# Patient Record
Sex: Female | Born: 1980 | Race: White | Hispanic: Yes | Marital: Single | Smoking: Never smoker
Health system: Southern US, Community
[De-identification: ages and names within clinical notes are randomized; demographics above are authoritative.]

---

## 2002-11-12 ENCOUNTER — Inpatient Hospital Stay (HOSPITAL_COMMUNITY): Admission: AD | Admit: 2002-11-12 | Discharge: 2002-11-16 | Payer: Self-pay | Admitting: Obstetrics & Gynecology

## 2006-03-01 ENCOUNTER — Emergency Department (HOSPITAL_COMMUNITY): Admission: EM | Admit: 2006-03-01 | Discharge: 2006-03-02 | Payer: Self-pay | Admitting: Emergency Medicine

## 2006-06-09 ENCOUNTER — Ambulatory Visit: Payer: Self-pay | Admitting: Family Medicine

## 2006-06-09 ENCOUNTER — Inpatient Hospital Stay (HOSPITAL_COMMUNITY): Admission: AD | Admit: 2006-06-09 | Discharge: 2006-06-09 | Payer: Self-pay | Admitting: Gynecology

## 2006-06-09 ENCOUNTER — Inpatient Hospital Stay (HOSPITAL_COMMUNITY): Admission: AD | Admit: 2006-06-09 | Discharge: 2006-06-09 | Payer: Self-pay | Admitting: Family Medicine

## 2006-07-28 ENCOUNTER — Ambulatory Visit: Payer: Self-pay | Admitting: Gynecology

## 2006-07-29 ENCOUNTER — Inpatient Hospital Stay (HOSPITAL_COMMUNITY): Admission: RE | Admit: 2006-07-29 | Discharge: 2006-08-01 | Payer: Self-pay | Admitting: Gynecology

## 2006-07-29 ENCOUNTER — Ambulatory Visit: Payer: Self-pay | Admitting: Obstetrics & Gynecology

## 2006-08-11 ENCOUNTER — Ambulatory Visit: Payer: Self-pay | Admitting: *Deleted

## 2006-08-11 ENCOUNTER — Inpatient Hospital Stay (HOSPITAL_COMMUNITY): Admission: AD | Admit: 2006-08-11 | Discharge: 2006-08-11 | Payer: Self-pay | Admitting: Family Medicine

## 2010-07-03 NOTE — Discharge Summary (Signed)
[name], Laura Terry        ACCOUNT NO.:  [id number]   MEDICAL RECORD NO.:  [id number]          PATIENT TYPE:  INP   LOCATION:  9130                          FACILITY:  [facility]   PHYSICIAN:  Allie Bossier, MD        DATE OF BIRTH:  1980-09-26   DATE OF ADMISSION:  07/29/2006  DATE OF DISCHARGE:  08/01/2006                               DISCHARGE SUMMARY   ADMISSION DIAGNOSES:  1. Intrauterine pregnancy at term.  2. History of previous cesarean section.  3. Anemia.   DISCHARGE DIAGNOSES:  1. Term pregnancy delivered via cesarean section.  2. History of previous cesarean section.  3. Anemia of pregnancy.   PRENATAL LABORATORY DATA:  Blood type O positive, RPR nonreactive,  rubella immune, hepatitis B surface antigen negative, HIV nonreactive.   HOSPITAL COURSE:  The patient is a 30 year old gravida 2, para 1-0-0-1  at 55+ weeks' gestation who was admitted for an elective repeat cesarean  section.  The patient underwent repeat low transverse cesarean section  with delivery of a viable female infant with Apgars of 9 and 9 and birth  weight 7 pounds 8 ounces.  Please see operative note for complete  details of cesarean section.  Her postoperative course was unremarkable.  The patient remained hemodynamically stable and was felt to be stable  for discharge on postoperative day #3.  She was discharged home with  instructions to follow up at the [facility] Department in 6  weeks for her postpartum visit, pelvic rest  for 6 weeks, no heavy  lifting for 6 weeks.   Discharge medications included:  1. Ibuprofen 600 mg take 1 tablet every 6 hours as needed for pain.  2. Percocet 5/325 mg take 1 tablet every 4 to 6 hours as needed for      pain.  3. Prenatal vitamins 1 tablet daily.   For contraception, she will be getting the IUD at her 6-week postpartum  visit.  Her diet is regular.     ______________________________  [name], MD      Allie Bossier, MD  Electronically Signed   LNJ/MEDQ  D:  08/01/2006  T:  08/01/2006  Job:  [id number]

## 2010-07-03 NOTE — Op Note (Signed)
Laura Terry, Laura Terry        ACCOUNT NO.:  [id number]   MEDICAL RECORD NO.:  [id number]          PATIENT TYPE:  INP   LOCATION:  9130                          [facility]  [facility]   PHYSICIAN:  Allie Bossier, MD        DATE OF BIRTH:  Jul 07, 1980   DATE OF PROCEDURE:  07/29/2006  DATE OF DISCHARGE:                               OPERATIVE REPORT   PREOPERATIVE DIAGNOSIS:  IUP at term, history of previous cesarean  section.   POSTOPERATIVE DIAGNOSIS:  IUP at term, history of previous cesarean  section.   PROCEDURE:  Repeat low transverse cesarean section.   SURGEON:  Dr. Nicholaus Bloom   ASSISTANT:  Dr. Wilburt Finlay   ANESTHESIA:  Spinal and local.   SPECIMENS:  Placenta sent to labor and delivery. The patient also was  cord blood donor.   ESTIMATED BLOOD LOSS:  600 mL.   COMPLICATIONS:  None.   FINDINGS:  Viable female infant with Apgars of nine and nine.  Birth  weight 7 pounds 8 ounces.   REASON FOR PROCEDURE:  The patient is a gravida 2, para 1, at +40 weeks'  gestation who presents for an elective repeat cesarean section.   PROCEDURE:  The patient was taken to the operating room where her spinal  anesthesia was found to be adequate.  She was then prepped and draped in  the normal sterile fashion in the dorsal supine position with a leftward  tilt.  The Pfannenstiel skin incision was then made with scalpel and  carried through to the underlying layer of fascia.  The fascia was  incised in the midline and the incision extended laterally with the Mayo  scissors. The superior aspect of the fascial incision was then grasped  with the Kocher clamps, elevated and underlying rectus muscles dissected  off bluntly. Attention was then turned to the inferior aspect of the  incision which in a similar fashion was grasped, tented up with the  Kocher clamps and the rectus muscles dissected off bluntly. The rectus  muscles were then separated in the midline and the peritoneum  identified, tented up and entered sharply with the Metzenbaum scissors.  The peritoneal incision was then extended superiorly and inferiorly with  good visualization of the bladder. The right rectus muscles was  partially transected to allow more room for delivery of the infant.  The  bladder blade was inserted and the lower uterine segment incised in  transverse fashion with a scalpel.  The uterine incision was then  extended bluntly.  The bladder blade was removed and the infant's head  delivered via a vacuum extraction with one pull. The baby's nose and  mouth was suctioned and the cord clamped and cut.  The infant was handed  off to the waiting pediatricians. The placenta was then removed  manually.  The uterus exteriorized and cleared of all clots and debris.  The uterine incision was repaired with 0 chromic in a running locked  fashion. Excellent hemostasis was noted. The incision was reinspected  several times with excellent hemostasis noted. The gutters were cleared  of all clots. The muscles were inspected  and electrocautery used for  hemostasis. The fascia was reapproximated with 1-0 Prolene in a running  fashion. The skin was closed in a subcuticular fashion using 4-0 Vicryl  on a PS2 needle. The patient tolerated the procedure well.  Sponge, lap  and needle counts were correct x2.  1 gram of Ancef was given at cord  clamp. The patient was taken to the recovery room in stable condition.  Her Foley catheter drained clear urine throughout the case.     ______________________________  [name], MD      Allie Bossier, MD  Electronically Signed    LNJ/MEDQ  D:  07/30/2006  T:  07/30/2006  Job:  [id number]

## 2010-07-06 NOTE — Discharge Summary (Signed)
   NAMEEdward Terry                         ACCOUNT NO.:  [id number]   MEDICAL RECORD NO.:  [id number]                   PATIENT TYPE:  INP   LOCATION:  9122                                 [facility]ILITY[facility]  [facility]   PHYSICIAN:  Kathreen Cosier, M.D.           DATE OF BIRTH:  05-29-1980   DATE OF ADMISSION:  11/12/2002  DATE OF DISCHARGE:                                 DISCHARGE SUMMARY   HISTORY OF PRESENT ILLNESS:  The patient is a 30 year old primigravida, [facility]  September 23.  Negative GBS.  She was brought in for induction at term at  the patient's request.  On admission membranes were artificially ruptured.  The fluid was clear.  Cervix 1 cm, 70%, vertex, -3.  The patient received  Pitocin and eventually had a low transverse cesarean section delivering a  female, Apgar 9/9 weighing 7 pounds 12 ounces from the OP position.  Postoperatively she did well.  Her hemoglobin was 8.  She was asymptomatic.  She was discharged home on the third postoperative day, ambulatory, on a  regular diet, on Tylox for pain.  Instructions given to her in Spanish.   DISCHARGE DIAGNOSES:  Status post primary low transverse cesarean section at  term because of failed induction.                                               Kathreen Cosier, M.D.    BAM/MEDQ  D:  11/16/2002  T:  11/16/2002  Job:  [id number]

## 2010-07-06 NOTE — H&P (Signed)
   NAMEEdward Terry                         ACCOUNT NO.:  [id number]   MEDICAL RECORD NO.:  [id number]                   PATIENT TYPE:  INP   LOCATION:  9122                                 [facility]   PHYSICIAN:  Kathreen Cosier, M.D.           DATE OF BIRTH:  1980/03/16   DATE OF ADMISSION:  11/12/2002  DATE OF DISCHARGE:                                HISTORY & PHYSICAL   HISTORY OF PRESENT ILLNESS:  The patient is a 30 year old primigravida with  [facility] November 11, 2002; negative GBS.  She was admitted for induction at the  patient's request.  The membranes were ruptured at 7:30 a.m.  The patient  fluid was clear.  Cervix was 1 cm, 70%, and vertex at -3.  The patient was  started on Pitocin and rapidly was in a good pattern.  An IUPC was inserted  at 12:20 p.m.  She was 2 cm, 80%, with the vertex at -1 and at that time was  on 11 mU of Pitocin.  At 4:45 p.m. she was 2 cm, 90%, vertex, -1,  contracting every two to three minutes.  By 8 p.m. she was 4 cm, 90%, vertex  at a -1 with a lot of molding and by 12:45 a.m. on September 25 her cervix  was unchanged since 8 p.m. and it was decided she would be delivered by C-  section for failure to progress.   PHYSICAL EXAMINATION:  GENERAL:  Revealed a well-developed female in labor.  HEENT:  Negative.  LUNGS:  Clear.  HEART:  Regular rhythm, no murmurs or gallops.  ABDOMEN:  Term-sized uterus.  Estimated fetal weight was 7 pounds.  EXTREMITIES:  Negative.                                               Kathreen Cosier, M.D.    BAM/MEDQ  D:  11/13/2002  T:  11/13/2002  Job:  [id number]

## 2010-07-06 NOTE — Op Note (Signed)
   NAMEEdward Terry                         ACCOUNT NO.:  [id number]   MEDICAL RECORD NO.:  [id number]                   PATIENT TYPE:  INP   LOCATION:  9122                                 [facility]   PHYSICIAN:  Kathreen Cosier, M.D.           DATE OF BIRTH:  18-Oct-1980   DATE OF PROCEDURE:  11/13/2002  DATE OF DISCHARGE:                                 OPERATIVE REPORT   PREOPERATIVE DIAGNOSES:  1. Failure to progress in labor.  2. Failed induction.  3. Failed Pitocin.   SURGEON:  Kathreen Cosier, M.D.   ANESTHESIA:  Spinal.   PROCEDURE:  Patient placed on the operating table in supine position,  abdomen prepped and draped, bladder emptied with a Foley catheter.  A  transverse suprapubic incision made, carried down to the rectus fascia, the  fascia cleaned and incised the length of the incision, the recti muscles  retracted laterally, the peritoneum incised longitudinally.  A transverse  incision made in the visceral peritoneum above the bladder and the bladder  mobilized inferiorly.  A transverse low uterine incision made and the  patient delivered of a female, Apgar 9 and 9, from the OP position, weighing 7  pounds 12 ounces.  The placenta was posterior, removed manually.  The  uterine cavity cleaned with dry laps.  The uterine incision was closed with  a continuous suture of #1 chromic including the myometrium and endometrium.  The bladder flap was reattached with 2-0 chromic.  The uterus well-  contracted, tubes and ovaries normal.  Abdomen closed in layers, peritoneum  with continuous suture of 0 chromic, fascia with continuous suture of 0  Dexon, and the skin closed with subcuticular stitch of 3-0 Monocryl.  Blood  loss 1200 mL.  The patient tolerated the procedure well, taken to the  recovery room in good condition.                                               Kathreen Cosier, M.D.    BAM/MEDQ  D:  11/13/2002  T:  11/15/2002  Job:  [id number]

## 2010-12-05 LAB — DIFFERENTIAL
Basophils Absolute: 0.1
Lymphocytes Relative: 15
Lymphs Abs: 1.7
Monocytes Absolute: 0.5
Neutro Abs: 9.2 — ABNORMAL HIGH
Neutrophils Relative %: 79 — ABNORMAL HIGH

## 2010-12-05 LAB — CBC
Hemoglobin: 11.2 — ABNORMAL LOW
MCHC: 32.7
Platelets: 522 — ABNORMAL HIGH
RBC: 4.11

## 2010-12-05 LAB — URINALYSIS, ROUTINE W REFLEX MICROSCOPIC
Glucose, UA: NEGATIVE
Nitrite: NEGATIVE
Specific Gravity, Urine: 1.025

## 2010-12-05 LAB — URINE CULTURE: Colony Count: >=100000

## 2010-12-05 LAB — URINE MICROSCOPIC-ADD ON

## 2010-12-06 LAB — CBC
HCT: 33.6 — ABNORMAL LOW
HCT: 35.8 — ABNORMAL LOW
Hemoglobin: 9 — ABNORMAL LOW
MCHC: 32.7
MCV: 84.5
RBC: 3.16 — ABNORMAL LOW
RBC: 4.27
WBC: 13.4 — ABNORMAL HIGH
WBC: 9.3

## 2010-12-06 LAB — BASIC METABOLIC PANEL
Calcium: 8.8
GFR calc Af Amer: >60
GFR calc non Af Amer: >60
Potassium: 4.4
Sodium: 136

## 2010-12-06 LAB — TYPE AND SCREEN
ABO/RH(D): O POS
Antibody Screen: NEGATIVE

## 2015-07-21 ENCOUNTER — Emergency Department (HOSPITAL_COMMUNITY): Payer: Self-pay

## 2015-07-21 ENCOUNTER — Emergency Department (HOSPITAL_COMMUNITY)
Admission: EM | Admit: 2015-07-21 | Discharge: 2015-07-21 | Disposition: A | Discharge status: Home / Self Care | Payer: Self-pay | Attending: Emergency Medicine | Admitting: Emergency Medicine

## 2015-07-21 ENCOUNTER — Encounter (HOSPITAL_COMMUNITY): Payer: Self-pay

## 2015-07-21 DIAGNOSIS — R1030 Lower abdominal pain, unspecified: Secondary | ICD-10-CM

## 2015-07-21 DIAGNOSIS — R079 Chest pain, unspecified: Secondary | ICD-10-CM | POA: Insufficient documentation

## 2015-07-21 DIAGNOSIS — R0602 Shortness of breath: Secondary | ICD-10-CM | POA: Insufficient documentation

## 2015-07-21 DIAGNOSIS — R197 Diarrhea, unspecified: Secondary | ICD-10-CM | POA: Insufficient documentation

## 2015-07-21 DIAGNOSIS — R300 Dysuria: Secondary | ICD-10-CM | POA: Insufficient documentation

## 2015-07-21 DIAGNOSIS — R112 Nausea with vomiting, unspecified: Secondary | ICD-10-CM | POA: Insufficient documentation

## 2015-07-21 DIAGNOSIS — Z79899 Other long term (current) drug therapy: Secondary | ICD-10-CM | POA: Insufficient documentation

## 2015-07-21 DIAGNOSIS — R1032 Left lower quadrant pain: Secondary | ICD-10-CM | POA: Insufficient documentation

## 2015-07-21 LAB — URINALYSIS, ROUTINE W REFLEX MICROSCOPIC
Bilirubin Urine: NEGATIVE
GLUCOSE, UA: NEGATIVE mg/dL
Hgb urine dipstick: NEGATIVE
KETONES UR: NEGATIVE mg/dL
Nitrite: NEGATIVE
PH: 8 (ref 5.0–8.0)
Protein, ur: 30 mg/dL — AB
Specific Gravity, Urine: 1.024 (ref 1.005–1.030)

## 2015-07-21 LAB — URINE MICROSCOPIC-ADD ON: RBC / HPF: NONE SEEN RBC/hpf (ref 0–5)

## 2015-07-21 LAB — COMPREHENSIVE METABOLIC PANEL
ALBUMIN: 4.4 g/dL (ref 3.5–5.0)
ALT: 26 U/L (ref 14–54)
AST: 23 U/L (ref 15–41)
Alkaline Phosphatase: 84 U/L (ref 38–126)
Anion gap: 9 (ref 5–15)
BUN: 16 mg/dL (ref 6–20)
CHLORIDE: 104 mmol/L (ref 101–111)
CO2: 21 mmol/L — AB (ref 22–32)
Calcium: 9.7 mg/dL (ref 8.9–10.3)
Creatinine, Ser: 0.64 mg/dL (ref 0.44–1.00)
GFR calc Af Amer: >60 mL/min (ref >60)
GFR calc non Af Amer: >60 mL/min (ref >60)
GLUCOSE: 144 mg/dL — AB (ref 65–99)
POTASSIUM: 4.4 mmol/L (ref 3.5–5.1)
SODIUM: 134 mmol/L — AB (ref 135–145)
Total Bilirubin: 0.8 mg/dL (ref 0.3–1.2)
Total Protein: 8.3 g/dL — ABNORMAL HIGH (ref 6.5–8.1)

## 2015-07-21 LAB — I-STAT CG4 LACTIC ACID, ED
LACTIC ACID, VENOUS: 2.25 mmol/L — AB (ref 0.5–2.0)
Lactic Acid, Venous: 3.24 mmol/L (ref 0.5–2.0)
Lactic Acid, Venous: 1.26 mmol/L (ref 0.5–2.0)

## 2015-07-21 LAB — CBC
HEMATOCRIT: 36.1 % (ref 36.0–46.0)
Hemoglobin: 11 g/dL — ABNORMAL LOW (ref 12.0–15.0)
MCH: 22.6 pg — ABNORMAL LOW (ref 26.0–34.0)
MCHC: 30.5 g/dL (ref 30.0–36.0)
MCV: 74.3 fL — AB (ref 78.0–100.0)
Platelets: 540 10*3/uL — ABNORMAL HIGH (ref 150–400)
RBC: 4.86 MIL/uL (ref 3.87–5.11)
RDW: 18.1 % — AB (ref 11.5–15.5)
WBC: 22.3 10*3/uL — AB (ref 4.0–10.5)

## 2015-07-21 LAB — PREGNANCY, URINE: Preg Test, Ur: NEGATIVE

## 2015-07-21 LAB — I-STAT BETA HCG BLOOD, ED (MC, WL, AP ONLY): I-stat hCG, quantitative: 5.8 m[IU]/mL — ABNORMAL HIGH (ref <5)

## 2015-07-21 LAB — LIPASE, BLOOD: LIPASE: 28 U/L (ref 11–51)

## 2015-07-21 MED ORDER — HYDROCODONE-ACETAMINOPHEN 5-325 MG PO TABS: 2.0000 | ORAL_TABLET | ORAL | Status: AC | PRN

## 2015-07-21 MED ORDER — IOPAMIDOL (ISOVUE-300) INJECTION 61%
INTRAVENOUS | Status: AC
  Administered 2015-07-21: 100 mL
  Filled 2015-07-21: qty 100

## 2015-07-21 MED ORDER — ONDANSETRON HCL 4 MG PO TABS: 4.0000 mg | ORAL_TABLET | Freq: Four times a day (QID) | ORAL | Status: AC

## 2015-07-21 MED ORDER — SODIUM CHLORIDE 0.9 % IV BOLUS (SEPSIS)
1200.0000 mL | Freq: Once | INTRAVENOUS | Status: AC
  Administered 2015-07-21: 1200 mL via INTRAVENOUS

## 2015-07-21 MED ORDER — ONDANSETRON HCL 4 MG/2ML IJ SOLN
4.0000 mg | Freq: Once | INTRAMUSCULAR | Status: AC
  Administered 2015-07-21: 4 mg via INTRAVENOUS
  Filled 2015-07-21: qty 2

## 2015-07-21 MED ORDER — INSULIN ASPART 100 UNIT/ML IV SOLN: 5.0000 [IU] | Freq: Once | INTRAVENOUS | Status: DC

## 2015-07-21 MED ORDER — MORPHINE SULFATE (PF) 4 MG/ML IV SOLN
4.0000 mg | Freq: Once | INTRAVENOUS | Status: AC
  Administered 2015-07-21: 4 mg via INTRAVENOUS
  Filled 2015-07-21: qty 1

## 2015-07-21 MED ORDER — SODIUM CHLORIDE 0.9 % IV BOLUS (SEPSIS)
1000.0000 mL | Freq: Once | INTRAVENOUS | Status: AC
  Administered 2015-07-21: 1000 mL via INTRAVENOUS

## 2015-07-21 MED ORDER — DEXTROSE 5 % IV SOLN
1.0000 g | INTRAVENOUS | Status: DC
  Administered 2015-07-21: 1 g via INTRAVENOUS
  Filled 2015-07-21: qty 10

## 2015-07-21 NOTE — ED Notes (Signed)
oob to br for urine specimen

## 2015-07-21 NOTE — Discharge Instructions (Signed)
Medications: Zofran, Norco  Treatment: Take Zofran every 6 hours as needed for nausea and vomiting. Take Norco every 4 hours as needed for severe pain. For the first day, beginning with a clear liquid diet, including water, ginger ale, chicken broth. Following the first day, slowly introduce more bland foods including bananas, rice, applesauce, toast. You should begin feeling better in 3-4 days.  Follow-up: Please establish care with a primary care provider by calling the number circled on your discharge paperwork. Please return the emergency Department if you develop any new or worsening symptoms, including bloody stools, dizziness, or any other concerning symptoms.   Nuseas y Vmitos (Nausea and Vomiting) La nusea es la sensacin de Dentist en el estmago o de la necesidad de vomitar. El vmito es un reflejo por el que los contenidos del estmago salen por la boca. El vmito puede ocasionar prdida de lquidos del organismo (deshidratacin). Los nios y los [employer] pueden deshidratarse rpidamente (en especial si tambin tienen diarrea). Las nuseas y los vmitos son sntoma de un trastorno o enfermedad. Es importante Emergency planning/management officer causa de los sntomas. CAUSAS  Irritacin directa de la membrana que cubre el [place]. Esta irritacin puede ser resultado del aumento de la produccin de cido, (reflujo gastroesofgico), infecciones, intoxicacin alimentaria, ciertos medicamentos (como antinflamatorios no esteroideos), consumo de alcohol o de tabaco.  Seales del cerebro.Estas seales pueden ser un dolor de cabeza, exposicin al calor, trastornos del odo interno, aumento de la presin en el cerebro por lesiones, infeccin, un tumor o conmocin cerebral, estmulos emocionales o problemas metablicos.  Una obstruccin en el tracto gastrointestinal (obstruccin intestinal).  Ciertas enfermedades como la diabetes, problemas en la vescula biliar, apendicitis, problemas renales, cncer,  sepsis, sntomas atpicos de infarto o trastornos alimentarios.  Tratamientos mdicos como la quimioterapia y la radiacin.  Medicamentos que inducen al sueo (anestesia general) durante [place]. DIAGNSTICO  El mdico podr solicitarle algunos anlisis si los problemas no mejoran luego de [address]. Tambin podrn pedirle anlisis si los sntomas son graves o si el motivo de los vmitos o las nuseas no est claro. Los [employer] ser:   Anlisis de [place].  Anlisis de [place].  Pruebas de materia fecal.  Cultivos (para buscar evidencias de infeccin).  Radiografas u otros estudios por imgenes. Los [employer] de las pruebas lo ayudarn al mdico a tomar decisiones acerca del mejor curso de tratamiento o la necesidad de [employer].  TRATAMIENTO  Debe estar bien hidratado. Beba con frecuencia pequeas cantidades de lquido.Puede beber agua, bebidas deportivas, caldos claros o comer pequeos trocitos de hielo o gelatina para mantenerse hidratado.Cuando coma, hgalo lentamente para evitar las nuseas.Hay medicamentos para evitar las nuseas que pueden aliviarlo.  INSTRUCCIONES PARA EL CUIDADO DOMICILIARIO  Si su mdico le prescribe medicamentos tmelos como se le haya indicado.  Si no tiene hambre, no se fuerce a comer. Sin embargo, es necesario que tome lquidos.  Si tiene hambre alimntese con una dieta normal, a menos que el mdico le indique otra cosa.  Los mejores alimentos son Neomia Dear combinacin de carbohidratos complejos (arroz, trigo, papas, pan), carnes magras, yogur, frutas y Sports administrator.  Evite los alimentos ricos en grasas porque dificultan la digestin.  Beba gran cantidad de lquido para mantener la orina de tono claro o color amarillo plido.  Si est deshidratado, consulte a su mdico para que le d instrucciones especficas para volver a hidratarlo. Los signos de deshidratacin son:  Franz Dell sed.  Labios y boca secos.  Mareos.  [place] Jim  oscura.  Disminucin de la frecuencia y cantidad de la [place].  Confusin.  Tiene el pulso o la respiracin acelerados. SOLICITE ATENCIN MDICA DE INMEDIATO SI:  Vomita sangre o algo similar a la borra del caf.  La materia fecal (heces) es negra o tiene [place].  Sufre una cefalea grave o rigidez en el cuello.  Se siente confundido.  Siente dolor abdominal intenso.  Tiene dolor en el pecho o dificultad para respirar.  No orina por 8 horas.  Tiene la piel fra y pegajosa.  Sigue vomitando durante ms de 24 a 48 horas.  Tiene fiebre. ASEGRESE QUE:   Comprende estas instrucciones.  Controlar su enfermedad.  Solicitar ayuda inmediatamente si no mejora o si empeora.   Esta informacin no tiene Theme park managercomo fin reemplazar el consejo del mdico. Asegrese de hacerle al mdico cualquier pregunta que tenga.   Document Released: 02/24/2007 Document Revised: 04/29/2011 Elsevier Interactive Patient Education [employer]2016 Elsevier Inc.

## 2015-07-21 NOTE — ED Notes (Signed)
Pt arrives with c/abdominal pain and vomiting since this AM. Indicates low abdominal pain.

## 2015-07-21 NOTE — ED Provider Notes (Signed)
CSN: [id number]     Arrival date & time 07/21/15  0957 History   First MD Initiated Contact with Patient 07/21/15 1034     Chief Complaint  Patient presents with  . Abdominal Pain  . Emesis     (Consider location/radiation/quality/duration/timing/severity/associated sxs/prior Treatment) HPI Comments: Patient is a previously healthy 35 year old female who presents with left lower quadrant and periumbilical pain with nonbloody nausea, emesis, diarrhea. She reports the pain in associated symptoms began suddenly early this morning. Patient states her pain has been coming intermittently every 5-7 minutes and she rates her pain that is worse at 10/10. When the pain is not occurring, it completely goes away. The pain radiates to her low back. Patient states that when she feels the pain in her abdomen, she has pain in her left chest, shortness of breath, and a headache that resolves when the pain passes. Patient's LMP was May 20 and normally has heavy bleeding. Patient is not sexually active and hasn't been in the past 8 years. Patient denies any abnormal vaginal discharge. Patient is not on any birth control. Patient denies any fevers, however she says she feels warm when the pain comes and cold when it relieves. Patient has had some dysuria since her period finished over the past week. She denies any hematuria.  Patient is a 35 y.o. female presenting with abdominal pain and vomiting. The history is provided by the patient.  Abdominal Pain Associated symptoms: chest pain, diarrhea, dysuria, nausea, shortness of breath and vomiting   Associated symptoms: no chills, no fever and no sore throat   Emesis Associated symptoms: abdominal pain and diarrhea   Associated symptoms: no chills, no headaches and no sore throat     History reviewed. No pertinent past medical history. Past Surgical History  Procedure Laterality Date  . Cesarean section     No family history on file. Social History  Substance  Use Topics  . Smoking status: Never Smoker   . Smokeless tobacco: None  . Alcohol Use: No   OB History    Gravida Para Term Preterm AB TAB SAB Ectopic Multiple Living   2 2             Review of Systems  Constitutional: Negative for fever and chills.  HENT: Negative for facial swelling and sore throat.   Respiratory: Positive for shortness of breath.   Cardiovascular: Positive for chest pain.  Gastrointestinal: Positive for nausea, vomiting, abdominal pain and diarrhea.  Genitourinary: Positive for dysuria.  Musculoskeletal: Negative for back pain.  Skin: Negative for rash and wound.  Neurological: Negative for headaches.  Psychiatric/Behavioral: The patient is not nervous/anxious.       Allergies  Motrin  Home Medications   Prior to Admission medications   Medication Sig Start Date End Date Taking? Authorizing Provider  HYDROcodone-acetaminophen (NORCO/VICODIN) 5-325 MG tablet Take 2 tablets by mouth every 4 (four) hours as needed. 07/21/15   Kalayah Leske M Zyaire Dumas, PA-C  ondansetron (ZOFRAN) 4 MG tablet Take 1 tablet (4 mg total) by mouth every 6 (six) hours. 07/21/15   Haddy Mullinax M Alena Blankenbeckler, PA-C   BP 110/71 mmHg  Pulse 90  Temp(Src) 98.1 F (36.7 C) (Oral)  Resp 18  Ht 5\' 2"  (1.575 m)  Wt 72.576 kg  BMI 29.26 kg/m2  SpO2 100%  LMP 07/08/2015 Physical Exam  Constitutional: She appears well-developed and well-nourished. No distress.  HENT:  Head: Normocephalic and atraumatic.  Mouth/Throat: Oropharynx is clear and moist. No oropharyngeal exudate.  Eyes:  Conjunctivae are normal. Pupils are equal, round, and reactive to light. Right eye exhibits no discharge. Left eye exhibits no discharge. No scleral icterus.  Neck: Normal range of motion. Neck supple. No thyromegaly present.  Cardiovascular: Normal rate, regular rhythm, normal heart sounds and intact distal pulses.  Exam reveals no gallop and no friction rub.   No murmur heard. Pulmonary/Chest: Effort normal and breath sounds  normal. No stridor. No respiratory distress. She has no wheezes. She has no rales. She exhibits no tenderness.  Abdominal: Soft. Bowel sounds are normal. She exhibits no distension. There is tenderness in the periumbilical area and left lower quadrant. There is no rebound, no guarding, no tenderness at McBurney's point and negative Murphy's sign.    Musculoskeletal: She exhibits no edema.  Lymphadenopathy:    She has no cervical adenopathy.  Neurological: She is alert. Coordination normal.  Skin: Skin is warm and dry. No rash noted. She is not diaphoretic. No pallor.  Psychiatric: She has a normal mood and affect.  Nursing note and vitals reviewed.   ED Course  Procedures (including critical care time) Labs Review Labs Reviewed  COMPREHENSIVE METABOLIC PANEL - Abnormal; Notable for the following:    Sodium 134 (*)    CO2 21 (*)    Glucose, Bld 144 (*)    Total Protein 8.3 (*)    All other components within normal limits  CBC - Abnormal; Notable for the following:    WBC 22.3 (*)    Hemoglobin 11.0 (*)    MCV 74.3 (*)    MCH 22.6 (*)    RDW 18.1 (*)    Platelets 540 (*)    All other components within normal limits  URINALYSIS, ROUTINE W REFLEX MICROSCOPIC (NOT AT [facility]) - Abnormal; Notable for the following:    APPearance CLOUDY (*)    Protein, ur 30 (*)    Leukocytes, UA TRACE (*)    All other components within normal limits  URINE MICROSCOPIC-ADD ON - Abnormal; Notable for the following:    Squamous Epithelial / LPF 6-30 (*)    Bacteria, UA RARE (*)    All other components within normal limits  I-STAT BETA HCG BLOOD, ED ([facility], [facility], [facility] ONLY) - Abnormal; Notable for the following:    I-stat hCG, quantitative 5.8 (*)    All other components within normal limits  I-STAT CG4 LACTIC ACID, ED - Abnormal; Notable for the following:    Lactic Acid, Venous 3.24 (*)    All other components within normal limits  I-STAT CG4 LACTIC ACID, ED - Abnormal; Notable for the following:     Lactic Acid, Venous 2.25 (*)    All other components within normal limits  CULTURE, BLOOD (ROUTINE X 2)  CULTURE, BLOOD (ROUTINE X 2)  URINE CULTURE  LIPASE, BLOOD  PREGNANCY, URINE  I-STAT CG4 LACTIC ACID, ED    Imaging Review Ct Abdomen Pelvis W Contrast  07/21/2015  CLINICAL DATA:  Left lower quadrant pain. EXAM: CT ABDOMEN AND PELVIS WITH CONTRAST TECHNIQUE: Multidetector CT imaging of the abdomen and pelvis was performed using the standard protocol following bolus administration of intravenous contrast. CONTRAST:  100mL ISOVUE-300 IOPAMIDOL (ISOVUE-300) INJECTION 61% COMPARISON:  4/21/8 FINDINGS: Lower chest:  No acute findings. Hepatobiliary: No suspicious liver abnormalities identified. The gallbladder appears normal. There is no biliary dilatation. Pancreas: No mass, inflammatory changes, or other significant abnormality. Spleen: Within normal limits in size and appearance. Adrenals/Urinary Tract: Normal adrenal glands. The kidneys are within normal limits. The  urinary bladder appears normal. Stomach/Bowel: The stomach appears normal. No dilated loops of small bowel identified. The appendix is visualized and appears normal. No pathologic dilatation of the colon. Vascular/Lymphatic: Normal appearance of the abdominal aorta. No enlarged retroperitoneal or mesenteric adenopathy. No enlarged pelvic or inguinal lymph nodes. Reproductive: The uterus appears normal. Corpus luteum and simple cyst noted within the left ovary. Unremarkable appearance of the right ovary. Other: There is no ascites or focal fluid collections within the abdomen or pelvis. Musculoskeletal:  No aggressive lytic or sclerotic bone lesions. IMPRESSION: 1. No acute findings identified within the abdomen or pelvis. Electronically Signed   By: Signa Kell M.D.   On: 07/21/2015 15:03   I have personally reviewed and evaluated these images and lab results as part of my medical decision-making.   EKG Interpretation None       MDM   CBC shows WBC 22.3, hemoglobin 11.0. CMP shows sodium 134, CO2 21, glucose 144, protein 8.3. Lipase 28. Urine pregnancy negative. Lactic acid initially high but improved with fluids throughout ED course. 3 hour repeat lactic 1.26. Blood cultures sent. Ceftriaxone given in ED for suspected UTI, however UA shows trace leukocytes and rare bacteria. Urine culture sent. Would treat if culture returns positive. CT abdomen and pelvis shows no acute findings. Patient discharged with Zofran and Norco. Patient also evaluated by Dr. Effie Shy who is in agreement with plan. Patient pain and nausea controlled in ED. Disease process mostly likely caused by a gastroenteritis. Patient advised to follow-up in establish care with a primary care provider. Patient vitals stable throughout ED course and discharged in satisfactory condition.  Final diagnoses:  Lower abdominal pain  Nausea vomiting and diarrhea        Emi Holes, PA-C 07/21/15 1921  Mancel Bale, MD 07/22/15 [phone]

## 2015-07-21 NOTE — ED Notes (Signed)
EDP aware of patient Lactic Acid.

## 2015-07-21 NOTE — ED Provider Notes (Signed)
  Face-to-face evaluation   History: Ill since today with nausea, vomiting, diarrhea, weakness. No weakness, dizziness, fever, chills.  Physical exam: Alert, cooperative, somewhat obese. Abdomen soft, mild left lower quadrant tenderness. No rebound tenderness.  Medical screening examination/treatment/procedure(s) were conducted as a shared visit with non-physician practitioner(s) and myself.  I personally evaluated the patient during the encounter  Mancel BaleElliott Terence Bart, MD 07/22/15 [id number]

## 2015-07-21 NOTE — ED Notes (Signed)
P tc/o some pain at iv site. Good blood return and flushes easily. Pt repositioned and arm less sore.

## 2015-07-21 NOTE — ED Notes (Signed)
MD at bedside. 

## 2015-07-22 LAB — URINE CULTURE

## 2015-07-26 LAB — CULTURE, BLOOD (ROUTINE X 2)
Culture: NO GROWTH
Culture: NO GROWTH

## 2017-04-10 IMAGING — CT CT ABD-PELV W/ CM
2 of 4 series · 11 of 46 positions shown, 12 images · IV contrast (Iodine)
Comparison: [DATE]

CLINICAL DATA: Left lower quadrant pain.

EXAM:
CT ABDOMEN AND PELVIS WITH CONTRAST
TECHNIQUE: Multidetector CT imaging of the abdomen and pelvis was performed
using the standard protocol following bolus administration of
intravenous contrast.
CONTRAST:  100mL DN2G0E-1II IOPAMIDOL (DN2G0E-1II) INJECTION 61%

[Series 201: routine, idose (2) · axial · 0.78mm/px · z∈[+14,+419]mm · 8 of 99 slices shown, 9 images]
[im 9/99  soft-tissue]
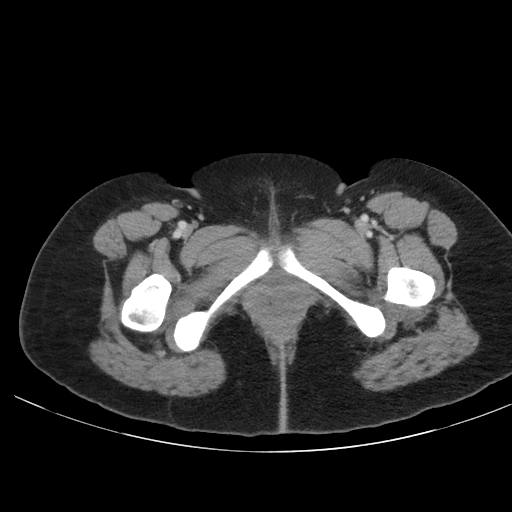
[im 9/99  bone]
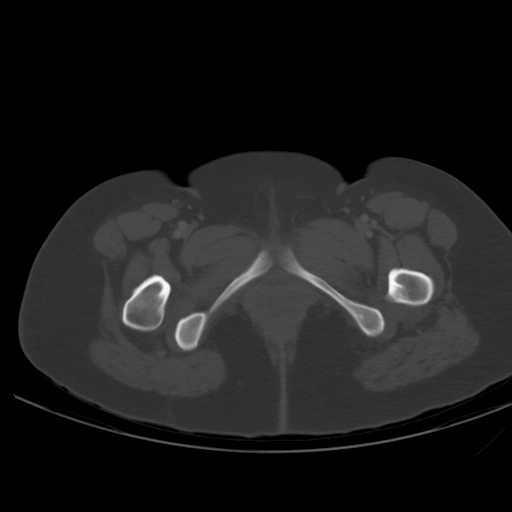
[im 21/99  soft-tissue]
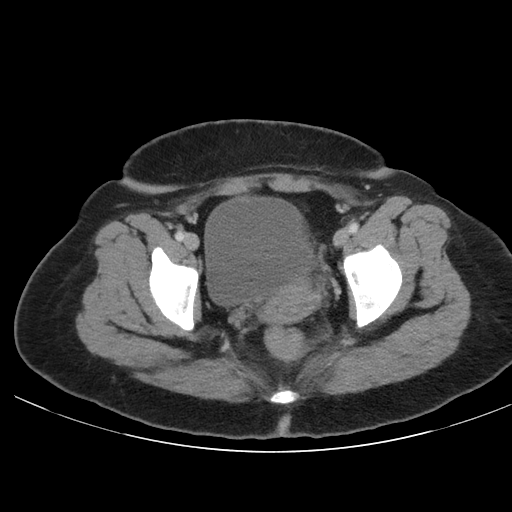
[im 33/99  soft-tissue]
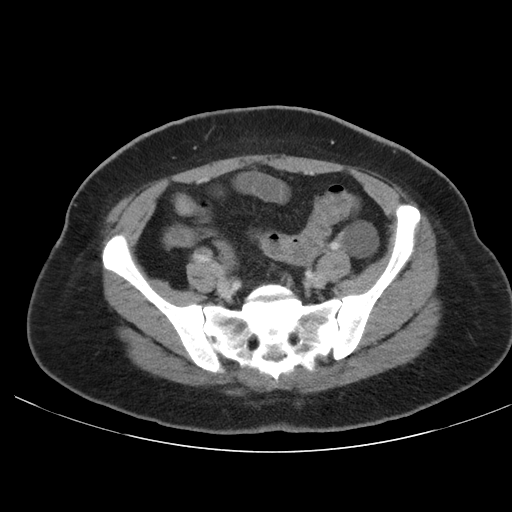
[im 45/99  soft-tissue]
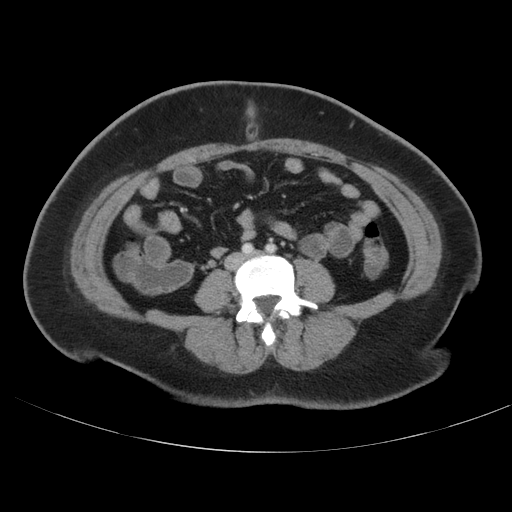
[im 54/99  soft-tissue]
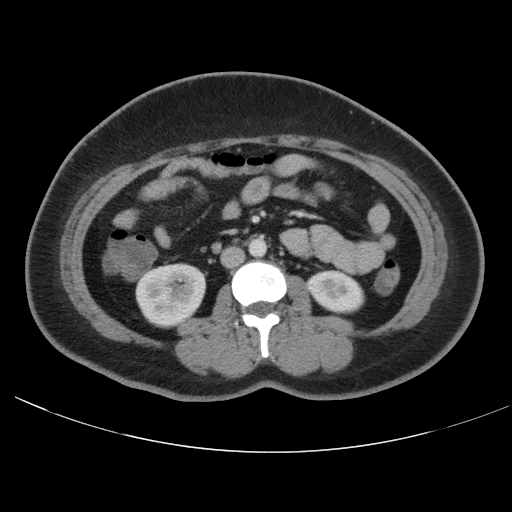
[im 66/99  soft-tissue]
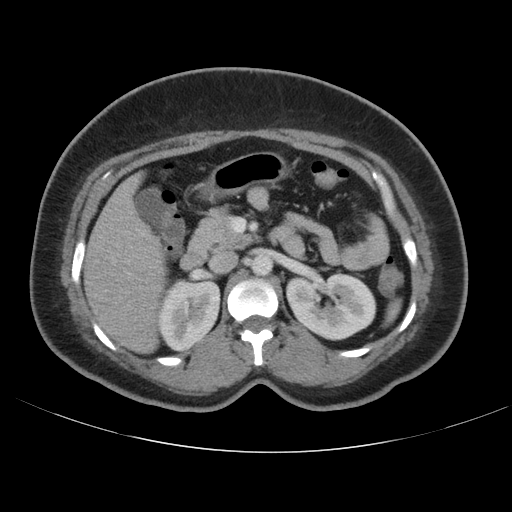
[im 78/99  soft-tissue]
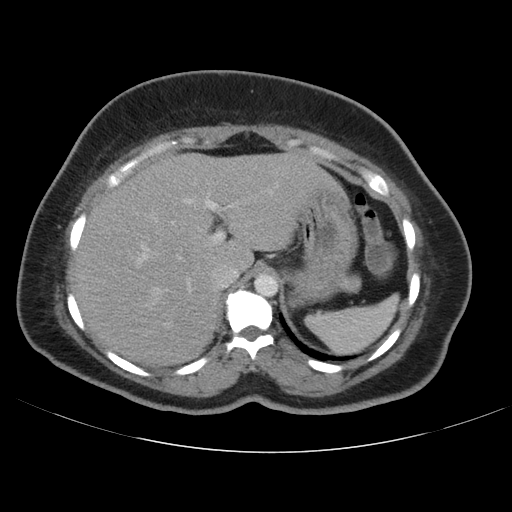
[im 90/99  soft-tissue]
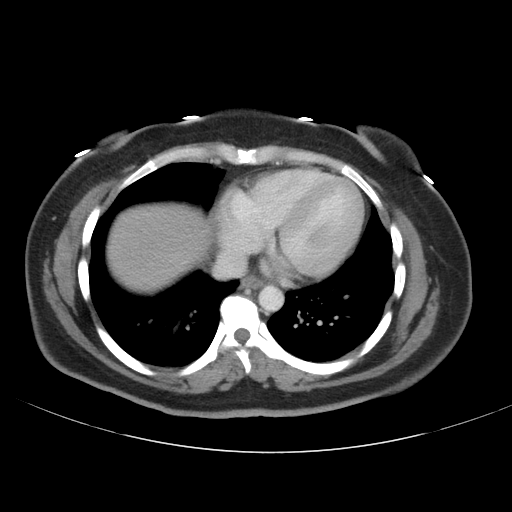

[Series 203: coronals, idose (2) · coronal · 0.45mm/px · 3 of 131 slices shown]
[im 44/131  soft-tissue]
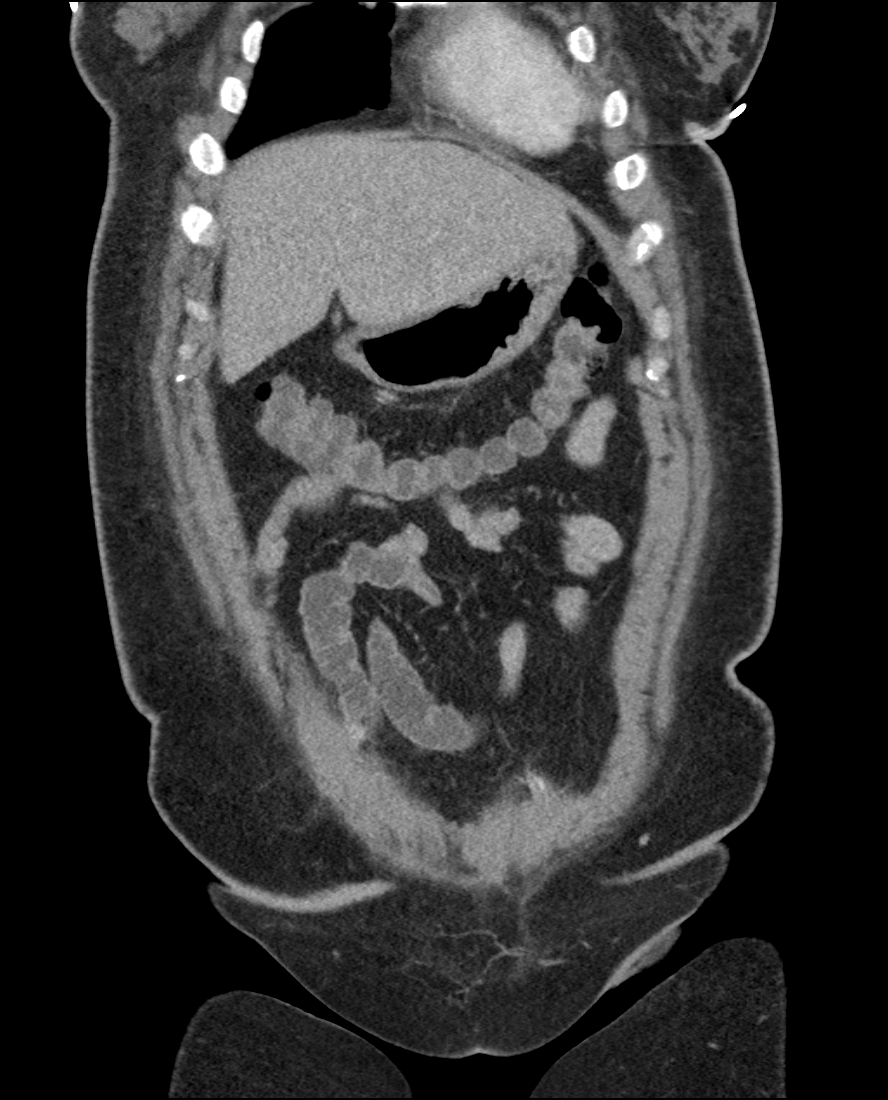
[im 58/131  soft-tissue]
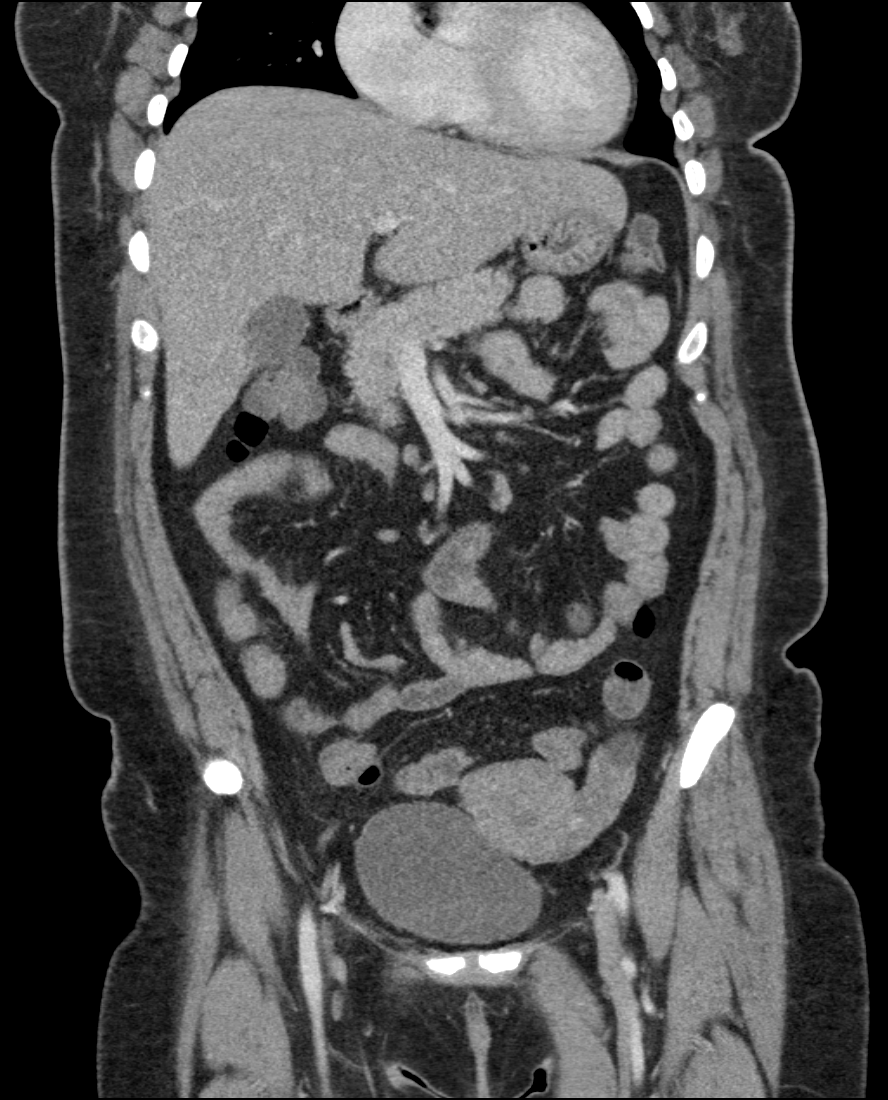
[im 73/131  soft-tissue]
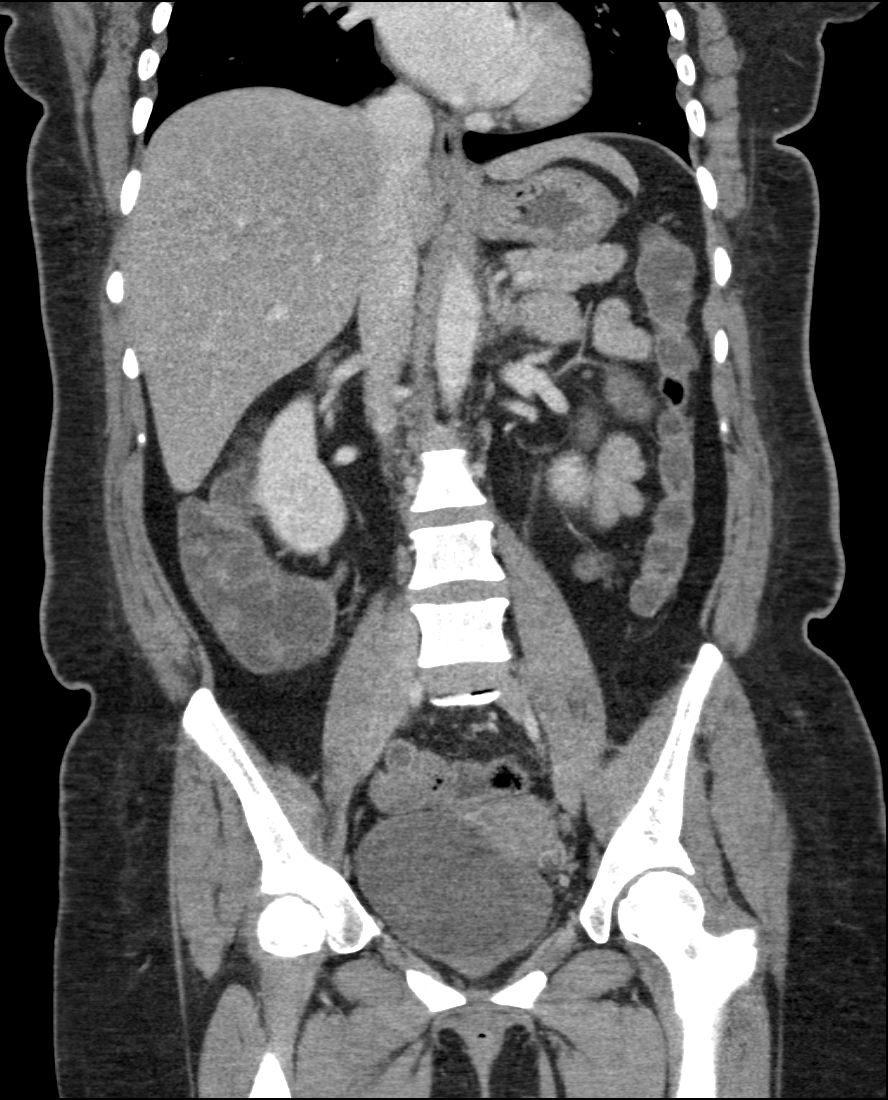

[11 of 46 positions shown; findings below may reference images not displayed]

FINDINGS: Lower chest:  No acute findings.

Hepatobiliary: No suspicious liver abnormalities identified. The
gallbladder appears normal. There is no biliary dilatation.

Pancreas: No mass, inflammatory changes, or other significant
abnormality.

Spleen: Within normal limits in size and appearance.

Adrenals/Urinary Tract: Normal adrenal glands. The kidneys are
within normal limits. The urinary bladder appears normal.

Stomach/Bowel: The stomach appears normal. No dilated loops of small
bowel identified. The appendix is visualized and appears normal. No
pathologic dilatation of the colon.

Vascular/Lymphatic: Normal appearance of the abdominal aorta. No
enlarged retroperitoneal or mesenteric adenopathy. No enlarged
pelvic or inguinal lymph nodes.

Reproductive: The uterus appears normal. Corpus luteum and simple
cyst noted within the left ovary. Unremarkable appearance of the
right ovary.

Other: There is no ascites or focal fluid collections within the
abdomen or pelvis.

Musculoskeletal:  No aggressive lytic or sclerotic bone lesions.
IMPRESSION: 1. No acute findings identified within the abdomen or pelvis.

## 2019-12-24 ENCOUNTER — Other Ambulatory Visit: Payer: Self-pay

## 2019-12-24 ENCOUNTER — Ambulatory Visit: Payer: Self-pay | Admitting: Nurse Practitioner

## 2020-01-31 ENCOUNTER — Other Ambulatory Visit: Payer: Self-pay

## 2020-01-31 ENCOUNTER — Ambulatory Visit: Payer: Self-pay | Attending: Nurse Practitioner | Admitting: Nurse Practitioner
